# Patient Record
Sex: Female | Born: 1945 | Race: White | Hispanic: No | Marital: Single
Health system: Northeastern US, Academic
[De-identification: ages and names within clinical notes are randomized; demographics above are authoritative.]

---

## 2018-10-04 ENCOUNTER — Ambulatory Visit: Admitting: Internal Medicine

## 2018-10-04 ENCOUNTER — Observation Stay: Admit: 2018-10-04 | Disposition: A | Discharge status: Home / Self Care | Payer: Medicare Other

## 2018-10-04 LAB — HX CHEM-PANELS
HX ANION GAP: 8 (ref 3–14)
HX BLOOD UREA NITROGEN: 20 mg/dL (ref 6–24)
HX CHLORIDE (CL): 110 meq/L (ref 98–110)
HX CO2: 23 meq/L (ref 20–30)
HX CREATININE (CR): 0.71 mg/dL (ref 0.57–1.30)
HX GFR, AFRICAN AMERICAN: 99 mL/min/{1.73_m2}
HX GFR, NON-AFRICAN AMERICAN: 85 mL/min/{1.73_m2}
HX GLUCOSE: 92 mg/dL (ref 70–139)
HX POTASSIUM (K): 4.1 meq/L (ref 3.6–5.1)
HX SODIUM (NA): 141 meq/L (ref 135–145)

## 2018-10-04 LAB — HX HEM-ROUTINE
HX BASO #: 0 10*3/uL (ref 0.0–0.2)
HX BASO: 0 %
HX EOSIN #: 0.2 10*3/uL (ref 0.0–0.5)
HX EOSIN: 2 %
HX HCT: 42.9 % (ref 32.0–45.0)
HX HGB: 13.9 g/dL (ref 11.0–15.0)
HX IMMATURE GRANULOCYTE#: 0 10*3/uL (ref 0.0–0.1)
HX IMMATURE GRANULOCYTE: 0 %
HX LYMPH #: 1.5 10*3/uL (ref 1.0–4.0)
HX LYMPH: 15 %
HX MCH: 30.3 pg (ref 26.0–34.0)
HX MCHC: 32.4 g/dL (ref 32.0–36.0)
HX MCV: 93.7 fL (ref 80.0–98.0)
HX MONO #: 0.6 10*3/uL (ref 0.2–0.8)
HX MONO: 6 %
HX MPV: 10.1 fL (ref 9.1–11.7)
HX NEUT #: 7.8 10*3/uL — ABNORMAL HIGH (ref 1.5–7.5)
HX NRBC #: 0 10*3/uL
HX NUCLEATED RBC: 0 %
HX PLT: 169 10*3/uL (ref 150–400)
HX RBC BLOOD COUNT: 4.58 M/uL (ref 3.70–5.00)
HX RDW: 12.4 % (ref 11.5–14.5)
HX SEG NEUT: 77 %
HX WBC: 10.2 10*3/uL (ref 4.0–11.0)

## 2018-10-04 LAB — HX CHEM-METABOLIC: HX THYROID STIMULATING HORMONE (TSH): 1.07 u[IU]/mL (ref 0.35–4.94)

## 2018-10-04 LAB — HX TRANSFUSION

## 2018-10-04 LAB — HX DIABETES: HX GLUCOSE: 92 mg/dL (ref 70–139)

## 2018-10-04 LAB — HX CHEM-ENZ-FRAC: HX TROPONIN I: <0.01 ng/mL (ref 0.00–0.03)

## 2018-10-04 LAB — HX CHEM-OTHER
HX CALCIUM (CA): 8.6 mg/dL (ref 8.5–10.5)
HX MAGNESIUM: 2.2 mg/dL (ref 1.6–2.6)

## 2018-10-04 NOTE — ED Provider Notes (Signed)
.  .  Name: Kelly Hood, Kelly Hood  MRN: [id number]  Age: 72 yrs  Sex: Female  DOB: [birth year]  Arrival Date: 10/04/2018  Arrival Time: 09:02  Account#: [id number]  .  Working Diagnosis: Fall on same level, unspecified  PCP: Russella Dar, MD  .  HPI:  11/01  10:00 71y/o female with h/o depression/anxiety and bradycardia p/w    jl34        syncope and fall this morning. Pt is visiting from [place],        [place] for an art history conference. She arrived in [place]        yesterday and had a decent dinner. This morning while in line        for breakfast around 8:15am, she felt very lightheaded/dizzy        then lost consciousness, falling backwards onto her head. She        endorses head strike. LOC was for about 5 seconds. Upon        regaining consciousness, she was alert and oriented, no e/o        confusion. She denies other prodromal symptoms including CP,        palpitations, seizure-like activity, or focal weakness. She        felt dizzy and nauseated after coming to, but this quickly        resolved. EMS was called; HR on field was 48, BP 102/60. She        was brought to the ED accompanied by her roommate. She has not        had anything to eat or drink today. She currently has no        symptoms. She has been told by her PCP that she had bradycardia        and her BP tends to run low. She has had an EKG and other        studies done as an outpatient; she has not seen a cardiologist        or had any discussions about a pacemaker. She had syncope 10        years prior in the setting of a panic attack; this episode felt        different since she did not have any symptoms of anxiety or        panic. She denies any recent illnesses or hospitalizations..  .  Historical:  - Allergies: Sulfa (Sulfonamide Antibiotics); "most antibiotics";  - Home Meds: Wellbutrin Oral; Lexapro Oral;  - PMHx: Anxiety; Depression;  - Social history: Smoking status: The patient is not a current    smoker. Preferred Language: English.  -  Source of Home Medications: Patient.  - The history from nurses notes was reviewed: and I agree with    what is documented.  .  .  ROS:  10:06 Constitutional: Positive for poor PO intake, Negative for       jl34        chills, fever, weight loss.  10:06 Eyes: Negative for injury or acute deformity, blurry vision,        discharge, icterus, pain, redness, visual disturbance, vision        loss.  10:06 ENT: Negative for dental pain, difficulty swallowing, ear pain,  .  Name:Kelly Hood, Kelly Hood  [id number]  [id number]  Page 1 of 8  %%PAGE  .  Name: Kelly Hood, Kelly Hood  MRN: [id number]  Age: 41 yrs  Sex: Female  DOB: 1946/02/13  Arrival Date: 10/04/2018  Arrival Time: 09:02  Account#: [id number]  .  Working Diagnosis: Fall on same level, unspecified  PCP: Russella Dar, MD  .        hearing loss, nasal discharge, rhinorrhea, sinus congestion,        sinus pain, sore throat.  10:06 Neck Negative for injury or acute deformity, pain with        movement, pain at rest, stiffness, swollen nodes, tenderness,        bony tenderness.  10:06 Cardiovascular: Positive for Negative for chest pain,        orthopnea, palpitations, paroxysmal nocturnal dyspnea.  10:06 Respiratory: Negative for cough, dyspnea on exertion, shortness        of breath, wheezing.  10:06 Abdomen/GI: Positive for nausea, Negative for abdominal pain,        vomiting, diarrhea, constipation.  10:06 Back: Negative for decreased range of motion, vertebral        tenderness, acute changes.  10:06 GU: Negative for urinary symptoms, hematuria, burning with        urination, difficulty urinating, bladder incontinence.  10:06 MS/extremity: Negative for abrasion, contusion, decreased range        of motion, deformity, ecchymosis, erythema, injury or acute        deformity, pain, paresthesias, swelling, tenderness.  10:06 Neuro: Positive for dizziness, loss of consciousness, syncope,        Negative for altered mental status, headache, hearing loss,        seizure  activity, speech changes, tremor, visual changes.  10:18 Skin: Negative for abrasions, avulsion, cellulitis, ecchymosis, jl34        erythema, swelling.  10:18 Psych: Negative for anxiety, depression, drug dependence,        alcohol dependence.  .  Vital Signs:  09:10 Pulse 40; Resp 18; Temp 36.6; Pulse Ox 100% on R/A;             km31  09:17 BP 107 / 49;                                                    km31  09:37 BP 108 / 52; Pulse 47; Resp 16; Pulse Ox 95% on R/A; Pain 0/10; ad34  11:32 BP 103 / 52; Pulse 54; Resp 13; Temp 36.3; Pulse Ox 99% on R/A; ad34        Pain 0/10;  .  Glasgow Coma Score:  09:10 Eye Response: spontaneous(4). Verbal Response: oriented(5).     km31        Motor Response: obeys commands(6). Total: 15.  09:25 Eye Response: spontaneous(4). Verbal Response: oriented(5).     ad34        Motor Response: obeys commands(6). Total: 15.  14:52 Eye Response: spontaneous(4). Verbal Response: oriented(5).     ad34        Motor Response: obeys commands(6). Total: 15.  .  Exam:  .  Name:Kelly Hood, Kelly Hood  [id number]  [id number]  Page 2 of 8  %%PAGE  .  Name: Kelly Hood, Kelly Hood  MRN: [id number]  Age: 69 yrs  Sex: Female  DOB: [birth year]  Arrival Date: 10/04/2018  Arrival Time: 09:02  Account#: [id number]  .  Working Diagnosis: Fall on same level, unspecified  PCP: Russella Dar, MD  .  10:21 Neck: C-spine: appears grossly normal, no  vertebral tenderness, jl34        no crepitus, ROM/movement: is normal, is supple, without pain,        no range of motions limitations.  10:34 Constitutional: The patient appears alert, awake, comfortable,  jl34        Elderly Thin well groomed.  10:34 Head/face: Noted is no obvious of injury or deformity except        swelling, that is mild, of the  left occipital area and right        occipital area.  10:34 Eyes: Periorbital structures: appear normal, Pupils: equal,        round, and reactive to light. Extraocular movements: intact        throughout, Conjunctiva:  normal, Corneas: are normal, Sclera:        no appreciated abnormality, Anterior chamber: Lids and lashes:        appear normal, no evidence of trauma, Nystagmus: is not        appreciated.  10:34 ENT: External ear(s): are unremarkable, Ear canal(s): are        normal, TM's: are normal, Nose: External nose: no obvious acute        abnormality, abrasion, is not appreciated, nasal drainage, is        not appreciated, laceration, is not appreciated, Mouth: is        normal, Posterior pharynx: is normal, Dental exam: normal,        Voice: is normal.  10:42 Respiratory: Exam negative for  chest tenderness, rales,        jl34        rhonchi, stridor, wheezing, the patient does not display signs        of respiratory distress,  Respirations: normal, Breath sounds:        are normal.  10:42 Chest/axilla: Inspection: normal.  10:42 Cardiovascular: Rate: bradycardic, Rhythm: regular, Pulses:        Pulses are 2+ in right radial artery and left radial artery.        Heart sounds: normal, S1, S2, Edema: is not appreciated.  10:42 Abdomen/GI: Exam negative for discomfort, distension, guarding,        tenderness, Inspection: abdomen appears normal, Bowel sounds:        normal.  10:42 Back: Exam negative for decreased ROM, deformity, injury, pain        at rest, painful ROM, vertebral tenderness.  10:42 Musculoskeletal/extremity: Exam is negative for abrasion,        deformity, ecchymosis, injury, pain, swelling, ROM: intact in        all extremities.  10:42 Neuro: Orientation: is normal, to person, place, time /        situation. Mentation: is normal, lucid, Memory: is normal,        Cranial nerves: CN II- XII are normal as tested.  10:42 Psych: Behavior/mood is pleasant, cooperative, Affect is calm.  10:42 Skin: Exam negative for abrasion, obvious bony injury,        ecchymosis, laceration, swelling.  Kelly Hood, Kelly Hood  [id number]  [id number]  Page 3 of 8  %%PAGE  .  Name: Kelly Hood, Kelly Hood  MRN: [id number]  Age: 38  yrs  Sex: Female  DOB: [birth year]  Arrival Date: 10/04/2018  Arrival Time: 09:02  Account#: [id number]  .  Working Diagnosis: Fall on same level, unspecified  PCP: Russella Dar, MD  .  .  Trauma:  10:42 Glasgow Score=15, Trauma Score=12,  Darbey.Santa  .  MDM:  11:03 A consult was requested from: EP Cardiology.                    jl34  .  11/01  10:20 Order name: BUN (Blood Urea Nitrogen); Complete Time: 11:48     jl34  11/01  11:48 Interpretation: Within normal limits.                           jl34  11/01  10:20 Order name: CBC/Diff (With Plt); Complete Time: 11:48           jl34  11/01  11:48 Interpretation: Within normal limits.                           jl34  11/01  10:20 Order name: CR (Creatinine); Complete Time: 11:48               jl34  11/01  11:48 Interpretation: Within normal limits.                           jl34  11/01  10:20 Order name: GLU (Glucose); Complete Time: 11:48                 jl34  11/01  11:48 Interpretation: Within normal limits.                           jl34  11/01  10:20 Order name: LYTES (Na, K, Cl, Co2); Complete Time: 11:48        jl34  11/01  11:48 Interpretation: Within normal limits.                           jl34  11/01  10:20 Order name: Calcium (Ca); Complete Time: 11:48                  jl34  11/01  11:49 Interpretation: Within normal limits.                           jl34  11/01  10:20 Order name: Magnesium (Mg); Complete Time: 11:48                jl34  11/01  11:49 Interpretation: Within normal limits.                           jl34  11/01  10:20 Order name: Ct Head                                             jl34  11/01  10:23 Order name: Troponin I; Complete Time: 11:48                    jl34  11/01  11:49 Interpretation: Within normal limits.                           jl34  11/01  .  Name:Kelly Hood, Kelly Hood  [id number]  [id number]  Page 4 of 8  %%PAGE  .  Name: Kelly Hood, Kelly Hood  MRN: [id number]  Age: 82 yrs  Sex: Female  DOB:  Nov 18, 1946  Arrival Date: 10/04/2018  Arrival Time: 09:02  Account#: [id number]  .  Working Diagnosis: Fall on same level, unspecified  PCP: Russella Dar, MD  .  11:42 Order name: GFR, AA; Complete Time: 11:48                       dispa  t  11/01  11:42 Order name: GFR, NAA; Complete Time: 11:48                      dispa  t  11/01  11:49 Interpretation: Within normal limits.                           jl34  11/01  12:00 Order name: Blood Bank Hold; Complete Time: 12:03               dispa  t  .  Dispensed Medications:  11:11 Drug: NS - Sodium Chloride 0.9% IV ml 1000 mL Route: IV; Rate:  ad34        Bolus; Site: right hand;  .  .  Radiology Orders:  Order Name: Ct Head; Last Status: Returned; Time: 10/04/18    10:20; By: [email or web address]; For: jl34; Order Method: Electronic; Notes:    Bed Name: A6  Attending Notes:  10:18 Attestation: Assessment and care plan reviewed with             bb/ad  31        resident/midlevel provider. See their note for details.        Resident's history reviewed, patient interviewed and examined.        Attending HPI: Social History: Lives at home. I have reviewed        the Nurses Notes. Lab/Ancillary show: EKG interpreted by me and        shows: EKG reviewed by me. sinus bradycardia and no acute QST        changes.  11:32 Attending HPI: HPI: 72 y/o F with PMHx of anxiety and           bb/ad  31        depression presents to ED for evaluation of syncope. She flew        into [place] yesterday and began to feel light headedness and        dizzy which led to her fall this morning. She hit the back of        her head and was unconscious for about 5 seconds. Denies CP or        palpitations before hand. Denies any recent injuries. She is        taking aspirin but is not on anti-coagulants. Pt visiting from        [place]. She reports that her BP tends to run low and that she        had one previous episode of syncope around 10 years ago. no        abdominal pain or new back pain. Endorses  tingling in fingers        which has happened in previous episodes of dehydration. Denies        ha. Denies any other medical complaints. Pt is followed by Dr        Clearence Ped at [facility]  12:52 Attending ROS Abdomen/GI: Negative for abdominal pain,  Back:    bb/ad  31        Negative for new back pain, Neuro: Positive for loss of        consciousness, syncope, tingling, Negative for headache.        Lab/Ancillary show: Labs were reviewed and interpreted by me:  Kelly Hood, Kelly Hood  [id number]  [id number]  Page 5 of 8  %%PAGE  .  Name: Kelly Hood, Kelly Hood  MRN: [id number]  Age: 79 yrs  Sex: Female  DOB: 1946/09/29  Arrival Date: 10/04/2018  Arrival Time: 09:02  Account#: [id number]  .  Working Diagnosis: Fall on same level, unspecified  PCP: Russella Dar, MD  .        Labs revealed abnormal Neuts (7.8).  13:05 Attending Exam: My personal exam reveals Constitutional:        bb/ad  31        Appears comfortable. Head: NC/AT. Eyes: PERRL, EOMI,        conjunctivae pink, no jaundice. Mouth: Moist mucosa. Neck:        Supply, no adenopathy. Pulmonary: CTAB. CVS: RRR, normal S1 and        S2, no murmurs, no chest wall tenderness to palpation. Abdomen:        Soft, non-tender, non-distended, no masses, no peritoneal        signs. Extremities: No pedal edema, calf tenderness to        palpation, or asymmetry. Skin: Moist, no rash, brisk capillary        refill. Neuro: A/Ox3, fluent [place] language, lucid mentation        CN2-12 symmetric, and intact, 5/5 strength upper and lower        extremities prox/distal, sensation intact to light tough in        upper and lower extremities prox/distal.  13:09 ED Course: Pt presented to ED for evaluation of syncope. While  bb/ad  31        at the ED pt received IV fluids. Labs revealed abnormal Neuts        (7.8).Pt will receive further evaluation and management in the        hospital. normal sinus rhythm, rate of 70. Kelly Hood Kelly        Hood in  hospitalization but is now in agreement of        hospital services. All pt's questions were answered and pt was        agreeable to plan.  13:10 My Working Impression: 1. syncope episode. Scribe Scribe Chart  bb/ad  31        Complete By signing my name below, I, Jeanice Lim, attest        that this documentation has been prepared under the direction        and in the presence of Dr. Ezzard Standing, MD. Electronically signed        by Jeanice Lim, 10/04/2018.  Marland Kitchen  ED Observation:  11:03 REFER PATIENT TO ED OBSERVATION: The patient was informed of    jl34        the need for further observational care. Diagnosis Other        syncope. Reason for ED Observation: At the time of referral to        ED Observation status, a more precise diagnosis is needed and        further diagnosis is needed and further observation and testing        is required for diagnostic  accuracy. Treatment Plan (OBS) EKG        Monitoring IV Hydration Serial Cardiac Enzymes Specialist        Consultation EP cardiology. Progress Note: Patient continues to        be asymptomatic. Called PCP Dr. Odelia Gage office in        [place], [place]. PCP is not in office. Requested records to be        faxed to ED, last clinic note, EKGs, Holter monitor record,        echocardiograms, stress tests, etc.  .  Disposition Summary:  10/04/18 13:13  .  Name:Kelly Hood, Kelly Hood  [id number]  [id number]  Page 6 of 8  %%PAGE  .  Name: Kelly Hood, Kelly Hood  MRN: [id number]  Age: 45 yrs  Sex: Female  DOB: October 26, 1946  Arrival Date: 10/04/2018  Arrival Time: 09:02  Account#: [id number]  .  Working Diagnosis: Fall on same level, unspecified  PCP: Russella Dar, MD  .  Hospitalization Ordered        Hospitalization Status: Observation                             jl34        Provider: Tonna Boehringer        Clinical Setting: Adult Floor                                   jl34        Condition: Stable                                                jl34        Problem: new                                                    jl34        Symptoms: are resolved                                          jl34        Bed/Room Type: Regular                                          jl34        Room Assignment: Proger 7(10/04/18 13:38)                       mb29        Diagnosis          - Fall on same level, unspecified  Darbey.Santa        Forms:          - Handoff Communication Form                                  jl34  Signatures:  Gaylyn Rong                        MD   bb  Dispatcher, [employer]                          dispa  [place], Mathianaud                          mj11  Tessie Fass                             BSN  [address]                        RN   km31  Milda Smart                               MD   [address], Scribe               Karle Plumber  Juan Quam                     RN   [id number]  .  Corrections: (The following items were deleted from the chart)  10:19 10:18 Endocrine: Negative for jl34                              jl34  10:25 10:00 71y/o female with h/o depression/anxiety and bradycardia  jl34        p/w syncope and fall this morning. Pt is visiting from [place], [place] for an art history conference. She arrived in [place]        yesterday and had a decent dinner. This morning while in line        for breakfast around 8:15am, she felt very lightheaded/dizzy        then lost consciousness, falling backwards onto her head. She        endorses head strike. LOC was for about 5 seconds. Upon        regaining consciousness, she was alert and oriented, no e/o        confusion. She denies other prodromal symptoms including CP,        palpitations, seizure-like activity, or focal weakness. She        felt dizzy and nauseated after coming to, but this quickly        resolved. She was brought to the ED accompanied by her        roommate. She has not had anything to eat or  drink today. She        currently has no symptoms. She has been told by her PCP that        she had bradycardia and her BP tends to run low. She has had an  EKG and other studies done as an outpatient; she has not seen a        cardiologist or had any discussions about a pacemaker. She had  .  Name:Kelly Hood, Kelly Hood  [id number]  [id number]  Page 7 of 8  %%PAGE  .  Name: Stellarose, Cerny  MRN: [id number]  Age: 68 yrs  Sex: Female  DOB: [birth year]  Arrival Date: 10/04/2018  Arrival Time: 09:02  Account#: [id number]  .  Working Diagnosis: Fall on same level, unspecified  PCP: Russella Dar, MD  .        syncope 10 years prior in the setting of a panic attack; this        episode felt different since she did not have any symptoms of        anxiety or panic. She denies any recent illnesses or        hospitalizations.Marland Kitchen jl34  10:47 10:34 ENT: External ear(s): are unremarkable, Ear canal(s): are jl34        normal, TM's: jl34  11:05 10:18 Psych: Negative for jl34                                  jl34  13:03 11:32 Attending HPI: HPI: 72 y/o F with PMHx of anxiety and     bb/ad  31        depression presents to ED for evaluation of syncope. She flew        into [place] yesterday and began to feel light headedness and        dizzy which led to her fall this morning. She hit the back of        her head and was unconscious for about 5 seconds. Denies CP or        palpitations before hand. Denies any recent injuries. She is        taking aspirin but is not on anti-coagulants. Pt visiting from        [place]. She reports that her BP tends to run low and that she        had one previous episode of syncope around 10 years ago. Denies        any other medical complaints. Pt is followed by Dr Clearence Ped at [facility]. EX/[id number]  13:11 13:09 ED Course: Pt presented to ED for evaluation of syncope.  bb/ad  31        While at the ED pt received IV fluids. Labs revealed abnormal        Neuts (7.8).Pt will  receive further evaluation and management        in the hospital. normal sinus rhythm, rate of 70. Kelly Hood        Kelly Hood in hospitalization but is now in agreement        of hospital services. All pt's questions were answered and pt        was agreeable to plan. XL/KG40  13:20 13:13 jl34                                                      mb29  13:38 13:20 *PENDING BED* mb29  mb29  .  Document is preliminary until electronically or manually signed by the atte  nding physician  .  .  .  .  .  .  .  .  .  .  .  .  .  Zeba, Luby  [id number]  [id number]  Page 8 of 8  .  %%END

## 2018-10-04 NOTE — ED Provider Notes (Signed)
.  .  Name: Kelly Hood, Kelly Hood  MRN: [id number]  Age: 72 yrs  Sex: Female  DOB: 1946/03/02  Arrival Date: 10/04/2018  Arrival Time: 09:02  Account#: [id number]  Bed Pending Adult  PCP: Russella Dar, MD  Chief Complaint: Syncope  .  Presentation:  11/01  09:02 Presenting complaint: EMS states: Syncope: Per EMS pt was in    km31        line at [employer] and became weak and fell to ground from        standing. LOC x 1-2 minutes. Denies head/neck pain. No obvious        trauma. Ambulated with assist on scene. BS 101 by EMS. Pt C/O        dizziness, denies CP or SOB. Pt in no obvious distress. HX of        anxiety.  09:02 Method Of Arrival: EMS: [facility]                              km31  09:02 Acuity: Adult 3                                                 km31  .  Historical:  - Allergies:  09:06 Sulfa (Sulfonamide Antibiotics);                                km31  09:06 "most antibiotics";                                             km31  - Home Meds:  09:06 Wellbutrin Oral [Active]; Lexapro Oral [Active];                km31  - PMHx:  09:06 Anxiety; Depression;                                            km31  .  - Social history: Smoking status: The patient is not a current    smoker. Preferred Language: English.  - Source of Home Medications: Patient.  - The history from nurses notes was reviewed: and I agree with    what is documented.  .  .  Screening:  09:10 SEPSIS SCREENING Does this patient have a suspected source of   km31        infection at this timequestion No SIRS Criteria (> = 2) No. Safety        screen: Patient feels safe. Fall Risk Secondary diagnosis (15        points) dizzy. Exposure Risk/Travel Screening: No. The patient        reports that they have not travelled outside out of the [place] in        the past 30 days.  09:31 Nutritional screening: No deficits noted. Tuberculosis          ad34        screening: No symptoms or risk factors identified.  .  Vital Signs:  09:10  Pulse 40; Resp 18; Temp 36.6;  Pulse Ox 100% on R/A;             km31  09:17 BP 107 / 49;                                                    km31  09:37 BP 108 / 52; Pulse 47; Resp 16; Pulse Ox 95% on R/A; Pain 0/10; ad34  11:32 BP 103 / 52; Pulse 54; Resp 13; Temp 36.3; Pulse Ox 99% on R/A; ad34  .  Name:Kelly Hood, Kelly Hood  [id number]  [id number]  Page 1 of 4  %%PAGE  .  Name: Kelly Hood, Kelly Hood  MRN: [id number]  Age: 12 yrs  Sex: Female  DOB: 1946/11/10  Arrival Date: 10/04/2018  Arrival Time: 09:02  Account#: [id number]  Bed Pending Adult  PCP: Russella Dar, MD  Chief Complaint: Syncope  .        Pain 0/10;  .  Glasgow Coma Score:  09:10 Eye Response: spontaneous(4). Verbal Response: oriented(5).     km31        Motor Response: obeys commands(6). Total: 15.  09:25 Eye Response: spontaneous(4). Verbal Response: oriented(5).     ad34        Motor Response: obeys commands(6). Total: 15.  14:52 Eye Response: spontaneous(4). Verbal Response: oriented(5).     ad34        Motor Response: obeys commands(6). Total: 15.  .  Triage Assessment:  09:06 General: Appears in no apparent distress, well nourished, well  km31        groomed.  09:08 Pain: Denies pain. Neuro: No deficits noted. Neuro: Eye         km31        opening: Spontaneously Level on consciousness: Sustained        Attention Verbal Response: Orientation: Oriented x 3 Speech:        Clear. Eye movements: No Gaze preference Facial Droop: Appears        Normal Motor Response: Obeys Commands Motor Strength: Right        Arm: Strong Left Arm: Strong Right Leg: Strong Left Leg: Strong        Right Grasp Strong Left Grasp Strong. Cardiovascular: No        deficits noted. Respiratory: No deficits noted. Airway is        patent Respiratory effort is even, unlabored, Respiratory        pattern is regular.  .  Assessment:  09:25 General: Appears in no apparent distress, comfortable. Pain:    ad34        Denies pain. Neuro: No deficits noted. Neuro: Patient reports        Pt reports feeling very  dizzy right before the syncopal episode        and then still dizzy for about 10 minutes afterward. Pt also        had some transient nausea which has resolved. Neuro: Patient        denies Eye opening: Spontaneously Level on consciousness:        Sustained Attention Verbal Response: Orientation: Oriented x 3        Speech: Clear. EENT: No deficits noted.  09:29 Neuro: Pupils: Right size (in mm) 5 Right Reaction: Brisk Left  ad34  Pupil (in mm) 5 Left Reaction Brisk Eye movements: No Gaze        preference Facial Droop: Appears Normal Motor Response: Obeys        Commands Motor Strength: Right Arm: Strong Left Arm: Strong        Right Leg: Strong Left Leg: Strong Right Grasp Strong Left        Grasp Strong Pronator Drift: Negative (Normal).  09:30 Cardiovascular: Rhythm is sinus bradycardia.                    ad34  09:31 Geriatric Evaluation: Is patient > 75 years oldquestion No.            ad34        Respiratory: No deficits noted. GI: Reports nausea. Skin: No  .  Name:Kelly Hood, Kelly Hood  [id number]  [id number]  Page 2 of 4  %%PAGE  .  Name: Kelly Hood, Kelly Hood  MRN: [id number]  Age: 37 yrs  Sex: Female  DOB: 08/01/1946  Arrival Date: 10/04/2018  Arrival Time: 09:02  Account#: [id number]  Bed Pending Adult  PCP: Russella Dar, MD  Chief Complaint: Syncope  .        deficits noted.  09:33 Reassessment: Received pt via EMS/stretcher with report of      ad34        syncopal episode while on line at [facility]. Pt and witness        deny patient hitting her head and are unsure as to whether        there was a loss of consciousness. Pt reports feeling quite        dizzy before the episode and then less dizzy and also nauseous        for about 10 minutes afterward. VS and 12 lead show sinus        bradycardia with resting rate in the 40s. Pt takes wellbutrin        and lexipro for home medications. Nausea has resolved by the        time of patient's arrival in ED room A6. Pt denies pain.  14:52 Neuro: No  deficits noted. Geriatric Evaluation: Is patient > 75 ad34        years oldquestion No.  .  Trauma:  10:42 Glasgow Score=15, Trauma Score=12,                              jl34  .  Observations:  09:02 Patient arrived in ED.                                          km31  09:05 Triage Completed.                                               km31  10:00 Patient Visited By: Caryl Never  10:09 Patient Visited By: Gaylyn Rong  bb  11:15 Registration completed.                                         mj11  11:15 Patient Visited By: Yolonda Kida                         mj11  11:50 Patient Visited By: Caryl Never  12:49 Patient Visited By: Jeanice Lim, Scribe                   bb/ad  31  13:20 Patient assigned to A6                                          mb29  13:38 Patient assigned to A6                                          mb29  .  Procedure:  09:18 EKG done. (by ED staff). No Old EKG Reviewed By: Dixie Dials MD.  09:31 Maintain field IV. Dressing intact. Site clean / dry. Gauge /   ad34        site: 20 right dorsal hand.  11:10 Troponin I Sent.                                                ad34  11:10 Magnesium (Mg) Sent.                                            ad34  11:10 Calcium (Ca) Sent.                                              ad34  11:10 BUN (Blood Urea Nitrogen) Sent.                                 ad34  11:10 CBC/Diff (With Plt) Sent.                                       ad34  11:10 CR (Creatinine) Sent.                                           ad34  11:11 GLU (Glucose) Sent.  ad34  11:11 LYTES (Na, K, Cl, Co2) Sent.                                    ad34  .  Marland Kitchen  Name:Kelly Hood, Kelly Hood  [id number]  [id number]  Page 3 of 4  %%PAGE  .  Name: Kelly Hood, Kelly Hood  MRN: [id number]  Age: 25 yrs  Sex: Female  DOB:  01/01/1946  Arrival Date: 10/04/2018  Arrival Time: 09:02  Account#: [id number]  Bed Pending Adult  PCP: Russella Dar, MD  Chief Complaint: Syncope  .  Dispensed Medications:  11:11 Drug: NS - Sodium Chloride 0.9% IV ml 1000 mL Route: IV; Rate:  ad34        Bolus; Site: right hand;  .  Marland Kitchen  Interventions:  09:10 Driver's License Scanned into Chart                             mj11  09:31 Armband on Call light in reach Bed in low position Side Rail up ad34        X 2 Adult with patient.  09:42 EMS Sheet Scanned into Chart                                    cj18  11:21 Demo Sheet Scanned into Chart                                   jb37  13:27 ECG/EKG Scanned into Chart                                      cj18  14:19 Patient Belongings Scanned into Chart                           jb37  .  Outcome:  13:13 Decision to Hospitalize by Provider.                            jl34  14:52 Admitted to [facility] with chart. Condition: stable. Discharge   ad34        Assessment: Patient awake, alert and oriented x 3. No cognitive        and/or functional deficits noted. Patient verbalized        understanding of disposition instructions. Patient awake and        alert. Oriented to person, place and time. Patient verbalized        understanding of disposition instructions. Patient has no        functional deficits. Chart Status Nursing note complete and        electronically signed.  14:54 Patient left the ED.                                            ad34  .  Signatures:  Secondary school teacher, Huel Cote  MD   bb  [employer], Mathianaud                          mj11  Tessie Fass                             BSN  [address]                        RN   km31  Milda Smart                               MD   [address] Scribe               Karle Plumber  Juan Quam                     RN   ad34  Gweneth Dimitri                          Sec  [id number]  Estill Batten                          Sec   [phone]  .  .  .  .  .  .  Jessly, Lebeck  [id number]  [id number]  Page 4 of 4  .  %%END

## 2018-10-05 LAB — HX HEM-ROUTINE
HX BASO #: 0 10*3/uL (ref 0.0–0.2)
HX BASO: 0 %
HX EOSIN #: 0.4 10*3/uL (ref 0.0–0.5)
HX EOSIN: 6 %
HX HCT: 38.1 % (ref 32.0–45.0)
HX HGB: 12.6 g/dL (ref 11.0–15.0)
HX IMMATURE GRANULOCYTE#: 0 10*3/uL (ref 0.0–0.1)
HX IMMATURE GRANULOCYTE: 0 %
HX LYMPH #: 2.1 10*3/uL (ref 1.0–4.0)
HX LYMPH: 31 %
HX MCH: 30.7 pg (ref 26.0–34.0)
HX MCHC: 33.1 g/dL (ref 32.0–36.0)
HX MCV: 92.9 fL (ref 80.0–98.0)
HX MONO #: 0.7 10*3/uL (ref 0.2–0.8)
HX MONO: 10 %
HX MPV: 9.9 fL (ref 9.1–11.7)
HX NEUT #: 3.5 10*3/uL (ref 1.5–7.5)
HX NRBC #: 0 10*3/uL
HX NUCLEATED RBC: 0 %
HX PLT: 164 10*3/uL (ref 150–400)
HX RBC BLOOD COUNT: 4.1 M/uL (ref 3.70–5.00)
HX RDW: 12.5 % (ref 11.5–14.5)
HX SEG NEUT: 52 %
HX WBC: 6.7 10*3/uL (ref 4.0–11.0)

## 2018-10-05 LAB — HX CHEM-PANELS
HX ANION GAP: 6 (ref 3–14)
HX BLOOD UREA NITROGEN: 16 mg/dL (ref 6–24)
HX CHLORIDE (CL): 112 meq/L — ABNORMAL HIGH (ref 98–110)
HX CO2: 22 meq/L (ref 20–30)
HX CREATININE (CR): 0.64 mg/dL (ref 0.57–1.30)
HX GFR, AFRICAN AMERICAN: 103 mL/min/{1.73_m2}
HX GFR, NON-AFRICAN AMERICAN: 89 mL/min/{1.73_m2}
HX GLUCOSE: 99 mg/dL (ref 70–139)
HX POTASSIUM (K): 4 meq/L (ref 3.6–5.1)
HX SODIUM (NA): 140 meq/L (ref 135–145)

## 2018-10-05 LAB — HX CHEM-OTHER
HX CALCIUM (CA): 8.3 mg/dL — ABNORMAL LOW (ref 8.5–10.5)
HX MAGNESIUM: 1.9 mg/dL (ref 1.6–2.6)
HX PHOSPHORUS: 3 mg/dL (ref 2.7–4.5)

## 2018-10-05 LAB — HX DIABETES: HX GLUCOSE: 99 mg/dL (ref 70–139)

## 2018-10-08 ENCOUNTER — Ambulatory Visit
